# Patient Record
Sex: Male | Born: 1990 | Race: Black or African American | Hispanic: No | Marital: Single | Smoking: Former smoker
Health system: Southern US, Community
[De-identification: ages and names within clinical notes are randomized; demographics above are authoritative.]

---

## 2016-10-16 ENCOUNTER — Ambulatory Visit (INDEPENDENT_AMBULATORY_CARE_PROVIDER_SITE_OTHER): Payer: Self-pay

## 2016-10-16 ENCOUNTER — Ambulatory Visit (HOSPITAL_COMMUNITY)
Admission: EM | Admit: 2016-10-16 | Discharge: 2016-10-16 | Disposition: A | Discharge status: Home / Self Care | Payer: Self-pay | Attending: Family Medicine | Admitting: Family Medicine

## 2016-10-16 ENCOUNTER — Encounter (HOSPITAL_COMMUNITY): Payer: Self-pay | Admitting: *Deleted

## 2016-10-16 DIAGNOSIS — S46912A Strain of unspecified muscle, fascia and tendon at shoulder and upper arm level, left arm, initial encounter: Secondary | ICD-10-CM

## 2016-10-16 DIAGNOSIS — X58XXXA Exposure to other specified factors, initial encounter: Secondary | ICD-10-CM | POA: Insufficient documentation

## 2016-10-16 DIAGNOSIS — M545 Low back pain, unspecified: Secondary | ICD-10-CM

## 2016-10-16 DIAGNOSIS — F17200 Nicotine dependence, unspecified, uncomplicated: Secondary | ICD-10-CM

## 2016-10-16 DIAGNOSIS — Z79899 Other long term (current) drug therapy: Secondary | ICD-10-CM | POA: Insufficient documentation

## 2016-10-16 DIAGNOSIS — Z202 Contact with and (suspected) exposure to infections with a predominantly sexual mode of transmission: Secondary | ICD-10-CM

## 2016-10-16 DIAGNOSIS — M25512 Pain in left shoulder: Secondary | ICD-10-CM

## 2016-10-16 MED ORDER — NAPROXEN 500 MG PO TABS: 500.0000 mg | ORAL_TABLET | Freq: Two times a day (BID) | ORAL | 0 refills | Status: AC | PRN
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
## 2016-10-16 NOTE — Discharge Instructions (Signed)
Recommend start Naproxen  twice a day as directed for muscle pain. Recommend apply warm compresses to area for comfort. May do back exercises to help strengthen back muscles. Recommend no sexual intercourse for at least 7 days. Encouraged to use condoms with each and every future sexual encounter. Strongly encouraged to decrease and d/c smoking. Follow-up pending lab results. Also recommend call an Orthopedic group (2 listed) for follow-up to shoulder and back pain.
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
## 2016-10-17 LAB — HIV ANTIBODY (ROUTINE TESTING W REFLEX): HIV SCREEN 4TH GENERATION: NONREACTIVE

## 2016-10-17 LAB — URINE CYTOLOGY ANCILLARY ONLY
Chlamydia: NEGATIVE
Neisseria Gonorrhea: NEGATIVE
Trichomonas: NEGATIVE

## 2016-10-17 LAB — RPR: RPR: NONREACTIVE

## 2016-10-23 ENCOUNTER — Telehealth (HOSPITAL_COMMUNITY): Payer: Self-pay | Admitting: Emergency Medicine

## 2016-10-23 NOTE — Telephone Encounter (Signed)
Verified identity.  Reports labs as negative.

## 2017-07-16 ENCOUNTER — Encounter (HOSPITAL_COMMUNITY): Payer: Self-pay | Admitting: Family Medicine

## 2017-07-16 DIAGNOSIS — Z5321 Procedure and treatment not carried out due to patient leaving prior to being seen by health care provider: Secondary | ICD-10-CM | POA: Insufficient documentation

## 2017-07-16 DIAGNOSIS — Y929 Unspecified place or not applicable: Secondary | ICD-10-CM | POA: Insufficient documentation

## 2017-07-16 DIAGNOSIS — S61210A Laceration without foreign body of right index finger without damage to nail, initial encounter: Secondary | ICD-10-CM | POA: Insufficient documentation

## 2017-07-16 DIAGNOSIS — Y9389 Activity, other specified: Secondary | ICD-10-CM | POA: Insufficient documentation

## 2017-07-16 DIAGNOSIS — Y999 Unspecified external cause status: Secondary | ICD-10-CM | POA: Insufficient documentation

## 2017-07-16 DIAGNOSIS — W230XXA Caught, crushed, jammed, or pinched between moving objects, initial encounter: Secondary | ICD-10-CM | POA: Insufficient documentation

## 2017-07-16 NOTE — ED Triage Notes (Signed)
Patient reports he slammed his right index finger in a car door and has an laceration. Bleeding is controlled and has a bandage to the right index finger.

## 2017-07-17 ENCOUNTER — Ambulatory Visit (HOSPITAL_COMMUNITY)
Admission: EM | Admit: 2017-07-17 | Discharge: 2017-07-17 | Disposition: A | Discharge status: Home / Self Care | Payer: Self-pay

## 2017-07-17 ENCOUNTER — Emergency Department (HOSPITAL_COMMUNITY)
Admission: EM | Admit: 2017-07-17 | Discharge: 2017-07-17 | Disposition: A | Discharge status: Home / Self Care | Payer: Self-pay | Attending: Emergency Medicine | Admitting: Emergency Medicine

## 2017-07-17 ENCOUNTER — Emergency Department (HOSPITAL_COMMUNITY): Payer: Self-pay

## 2017-07-17 ENCOUNTER — Ambulatory Visit (HOSPITAL_COMMUNITY)
Admission: EM | Admit: 2017-07-17 | Discharge: 2017-07-17 | Disposition: A | Discharge status: Home / Self Care | Payer: Self-pay | Attending: Internal Medicine | Admitting: Internal Medicine

## 2017-07-17 ENCOUNTER — Ambulatory Visit (HOSPITAL_COMMUNITY): Payer: Self-pay

## 2017-07-17 ENCOUNTER — Encounter (HOSPITAL_COMMUNITY): Payer: Self-pay | Admitting: Emergency Medicine

## 2017-07-17 DIAGNOSIS — S61210A Laceration without foreign body of right index finger without damage to nail, initial encounter: Secondary | ICD-10-CM

## 2017-07-17 DIAGNOSIS — W230XXA Caught, crushed, jammed, or pinched between moving objects, initial encounter: Secondary | ICD-10-CM

## 2017-07-17 NOTE — Discharge Instructions (Signed)
WOUND CARE °Please return in 7-10 days to have your stitches/staples removed or sooner if you have concerns. ° Keep area clean and dry for 24 hours. Do not remove bandage, if applied. ° After 24 hours, remove bandage and wash wound gently with mild soap and warm water. Reapply a new bandage after cleaning wound, if directed. ° Continue daily cleansing with soap and water until stitches/staples are removed. ° Notify the office if you experience any of the following signs of infection: Swelling, redness, pus drainage, streaking, fever >101.0 F ° Notify the office if you experience excessive bleeding that does not stop after 15-20 minutes of constant, firm pressure. ° °

## 2017-07-17 NOTE — ED Notes (Signed)
Bed: UC01 Expected date:  Expected time:  Means of arrival:  Comments: Appointments 
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
## 2019-02-07 IMAGING — DX DG SHOULDER 2+V*L*
3 series · 3 of 3 positions shown · non-contrast
Comparison: No priors.

CLINICAL DATA: 26-year-old male with history of chronic left
shoulder pain increasing over the past week. Popping sensation of
the left shoulder.

EXAM:
LEFT SHOULDER - 2+ VIEW

[shoulder ap]
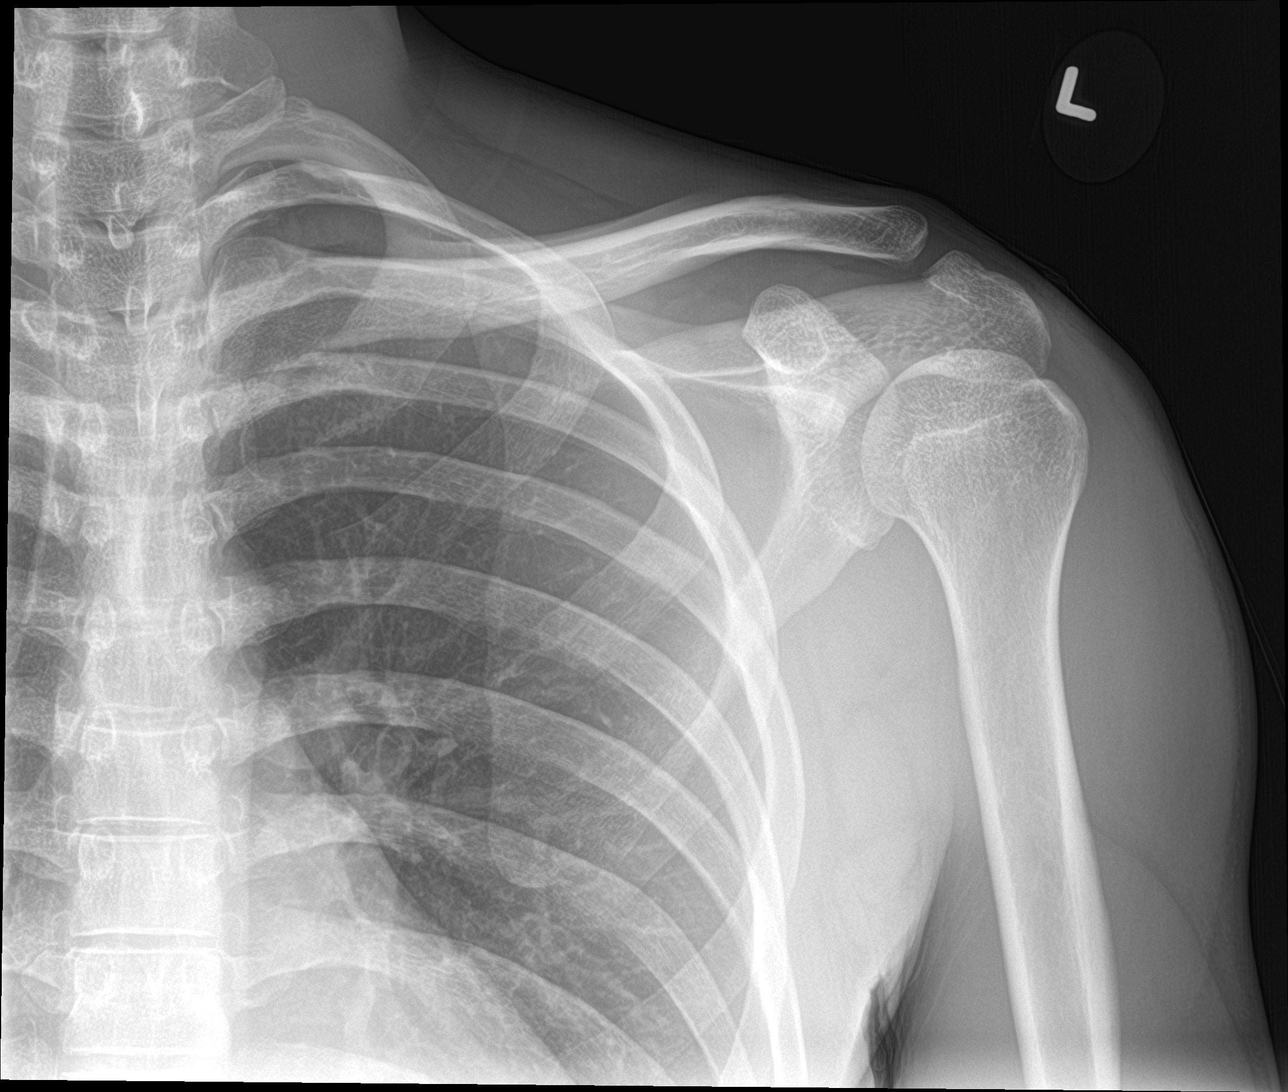

[shoulder grashey]
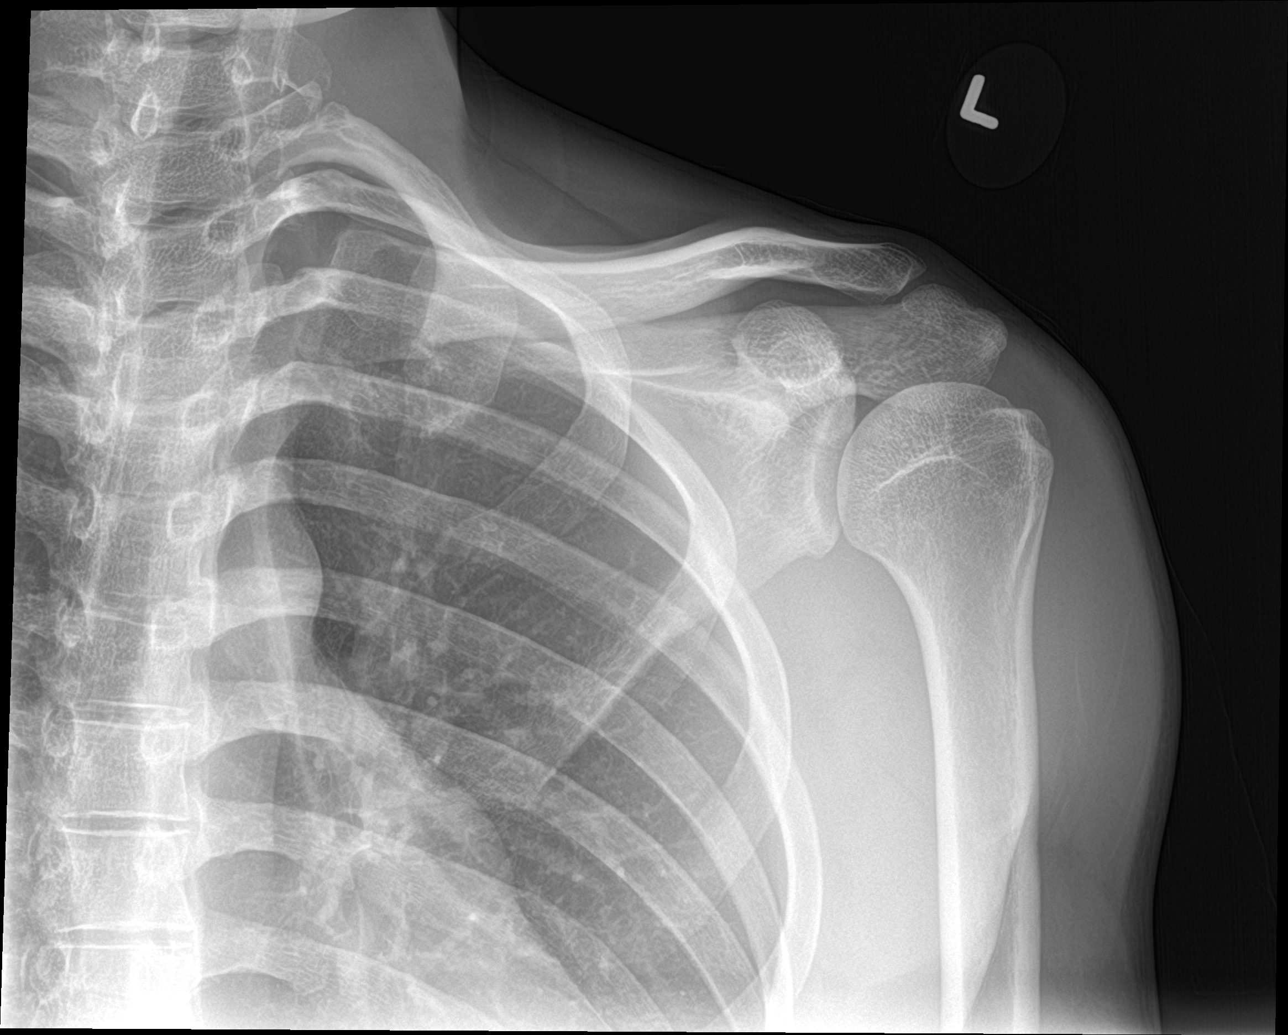

[shoulder y-view]
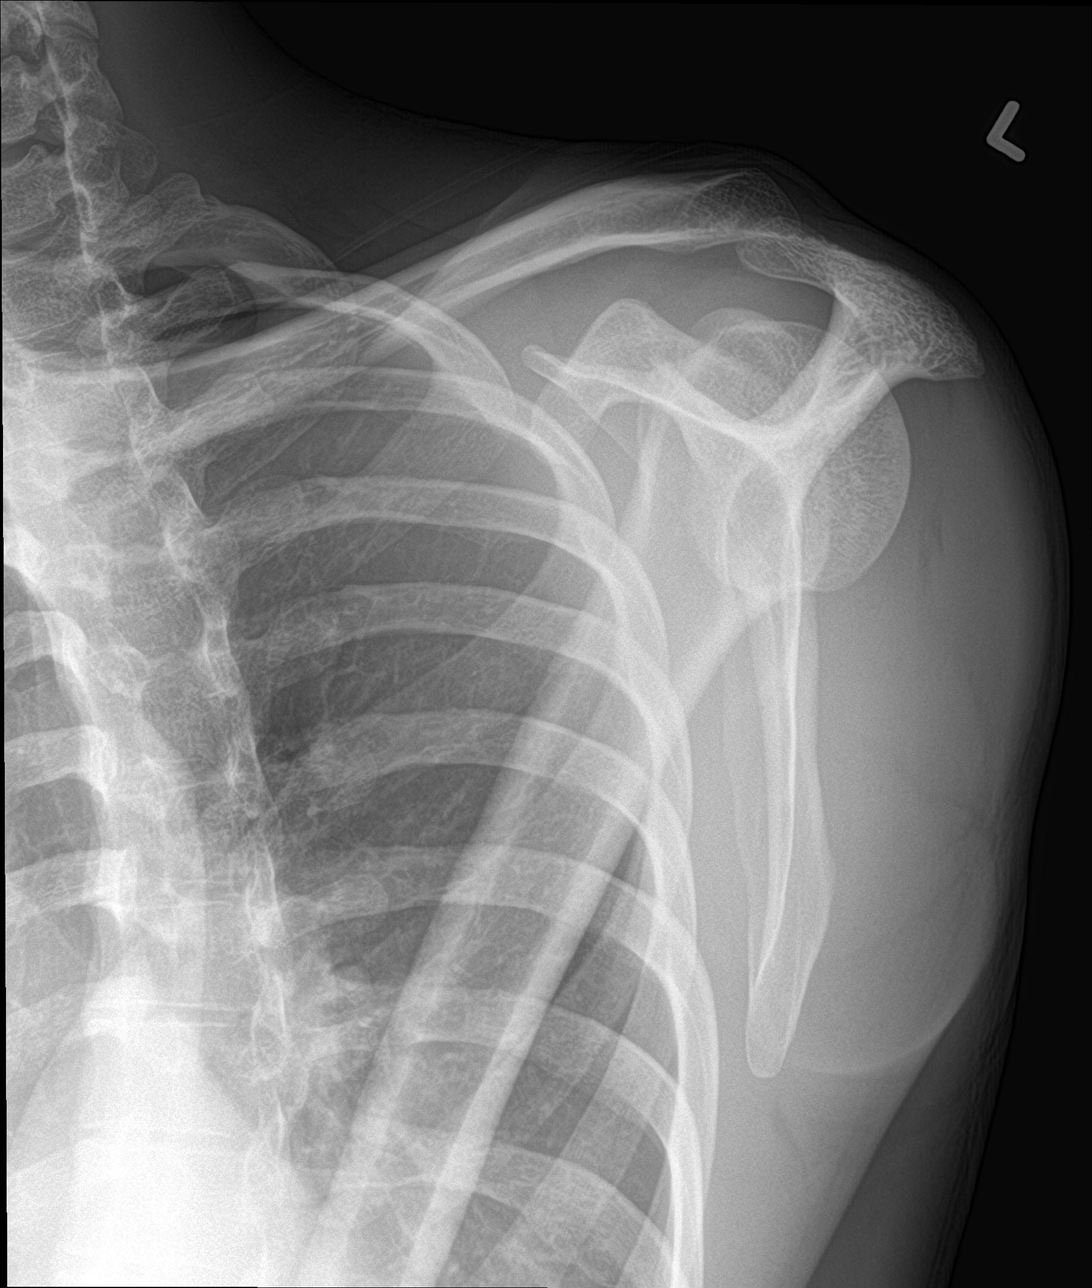

[3 of 3 positions shown; findings below may reference images not displayed]

FINDINGS: There is no evidence of fracture or dislocation. There is no
evidence of arthropathy or other focal bone abnormality. Soft
tissues are unremarkable.
IMPRESSION: Negative.
